# Patient Record
Sex: Male | Born: 1965 | Race: Black or African American | Marital: Married | Smoking: Never smoker
Health system: Southern US, Community
[De-identification: ages and names within clinical notes are randomized; demographics above are authoritative.]

## PROBLEM LIST (undated history)

## (undated) DIAGNOSIS — I10 Essential (primary) hypertension: Secondary | ICD-10-CM

## (undated) DIAGNOSIS — K219 Gastro-esophageal reflux disease without esophagitis: Secondary | ICD-10-CM

## (undated) DIAGNOSIS — M549 Dorsalgia, unspecified: Secondary | ICD-10-CM

## (undated) DIAGNOSIS — F419 Anxiety disorder, unspecified: Secondary | ICD-10-CM

## (undated) HISTORY — PX: KNEE ARTHROSCOPY WITH ANTERIOR CRUCIATE LIGAMENT (ACL) REPAIR: SHX5644

## (undated) HISTORY — DX: Anxiety disorder, unspecified: F41.9

## (undated) HISTORY — DX: Gastro-esophageal reflux disease without esophagitis: K21.9

## (undated) HISTORY — PX: KNEE SURGERY: SHX244

## (undated) HISTORY — DX: Essential (primary) hypertension: I10

## (undated) HISTORY — DX: Dorsalgia, unspecified: M54.9

---

## 2018-07-09 DIAGNOSIS — G4730 Sleep apnea, unspecified: Secondary | ICD-10-CM | POA: Insufficient documentation

## 2018-07-09 DIAGNOSIS — I10 Essential (primary) hypertension: Secondary | ICD-10-CM | POA: Insufficient documentation

## 2020-02-29 DIAGNOSIS — G8929 Other chronic pain: Secondary | ICD-10-CM | POA: Insufficient documentation

## 2020-02-29 DIAGNOSIS — M25572 Pain in left ankle and joints of left foot: Secondary | ICD-10-CM | POA: Insufficient documentation

## 2020-02-29 DIAGNOSIS — I10 Essential (primary) hypertension: Secondary | ICD-10-CM | POA: Insufficient documentation

## 2020-02-29 DIAGNOSIS — M47817 Spondylosis without myelopathy or radiculopathy, lumbosacral region: Secondary | ICD-10-CM | POA: Insufficient documentation

## 2020-04-18 DIAGNOSIS — R200 Anesthesia of skin: Secondary | ICD-10-CM | POA: Insufficient documentation

## 2020-04-18 DIAGNOSIS — G2581 Restless legs syndrome: Secondary | ICD-10-CM | POA: Insufficient documentation

## 2020-08-22 DIAGNOSIS — M179 Osteoarthritis of knee, unspecified: Secondary | ICD-10-CM | POA: Insufficient documentation

## 2020-08-22 DIAGNOSIS — Z6837 Body mass index (BMI) 37.0-37.9, adult: Secondary | ICD-10-CM | POA: Insufficient documentation

## 2020-08-22 DIAGNOSIS — M1710 Unilateral primary osteoarthritis, unspecified knee: Secondary | ICD-10-CM | POA: Insufficient documentation

## 2020-08-24 ENCOUNTER — Other Ambulatory Visit: Payer: Self-pay | Admitting: Neurosurgery

## 2020-08-24 ENCOUNTER — Other Ambulatory Visit (HOSPITAL_COMMUNITY): Payer: Self-pay | Admitting: Neurosurgery

## 2020-08-24 DIAGNOSIS — R29898 Other symptoms and signs involving the musculoskeletal system: Secondary | ICD-10-CM

## 2020-08-31 ENCOUNTER — Other Ambulatory Visit: Payer: Self-pay

## 2020-08-31 ENCOUNTER — Ambulatory Visit (HOSPITAL_COMMUNITY)
Admission: RE | Admit: 2020-08-31 | Discharge: 2020-08-31 | Disposition: A | Discharge status: Home / Self Care | Payer: No Typology Code available for payment source | Source: Ambulatory Visit | Attending: Neurosurgery | Admitting: Neurosurgery

## 2020-08-31 DIAGNOSIS — M48061 Spinal stenosis, lumbar region without neurogenic claudication: Secondary | ICD-10-CM | POA: Insufficient documentation

## 2020-08-31 DIAGNOSIS — R29898 Other symptoms and signs involving the musculoskeletal system: Secondary | ICD-10-CM | POA: Insufficient documentation

## 2020-08-31 DIAGNOSIS — M545 Low back pain, unspecified: Secondary | ICD-10-CM | POA: Diagnosis not present

## 2020-08-31 MED ORDER — ONDANSETRON HCL 4 MG/2ML IJ SOLN: 4.0000 mg | Freq: Four times a day (QID) | INTRAMUSCULAR | Status: DC | PRN

## 2020-08-31 MED ORDER — DIAZEPAM 5 MG PO TABS
ORAL_TABLET | ORAL | Status: AC
  Filled 2020-08-31: qty 2

## 2020-08-31 MED ORDER — DIAZEPAM 5 MG PO TABS
10.0000 mg | ORAL_TABLET | Freq: Once | ORAL | Status: AC
  Administered 2020-08-31: 10 mg via ORAL

## 2020-08-31 MED ORDER — IOHEXOL 180 MG/ML  SOLN
20.0000 mL | Freq: Once | INTRAMUSCULAR | Status: AC | PRN
  Administered 2020-08-31: 18 mL via INTRATHECAL

## 2020-08-31 MED ORDER — TRAMADOL HCL 50 MG PO TABS
50.0000 mg | ORAL_TABLET | Freq: Four times a day (QID) | ORAL | Status: DC | PRN
  Administered 2020-08-31: 100 mg via ORAL
  Filled 2020-08-31: qty 2

## 2020-08-31 MED ORDER — LIDOCAINE HCL (PF) 1 % IJ SOLN
5.0000 mL | Freq: Once | INTRAMUSCULAR | Status: AC
  Administered 2020-08-31: 20 mL via INTRADERMAL

## 2020-08-31 NOTE — Discharge Instructions (Signed)
Myelogram  A myelogram is an imaging test. This test checks for problems in the spinal cord and the places where nerves attach to the spinal cord (nerve roots). A dye (contrast material) is put into your spine before the X-ray. This provides a clearer image for your doctor to see. You may need this test if you have a spinal cord problem that cannot be diagnosed with other imaging tests. You may also have this test to check your spine after surgery. Tell a doctor about:  Any allergies you have, especially to iodine.  All medicines you are taking, including vitamins, herbs, eye drops, creams, and over-the-counter medicines.  Any problems you or family members have had with anesthetic medicines or dye.  Any blood disorders you have.  Any surgeries you have had.  Any medical conditions you have or have had, including asthma.  Whether you are pregnant or may be pregnant. What are the risks? Generally, this is a safe procedure. However, problems may occur, including:  Infection.  Bleeding.  Allergic reaction to medicines or dyes.  Damage to your spinal cord or nerves.  Leaking of spinal fluid. This can cause a headache.  Damage to kidneys.  Seizures. This is rare. What happens before the procedure?  Follow instructions from your doctor about what you cannot eat or drink. You may be asked to drink more fluids.  Ask your doctor about changing or stopping your normal medicines. This is important if you take diabetes medicines or blood thinners.  Plan to have someone take you home from the hospital or clinic.  If you will be going home right after the procedure, plan to have someone with you for 24 hours. What happens during the procedure?  You will lie face down on a table.  Your doctor will find the best injection site on your spine. This is most often in the lower back.  This area will be washed with soap.  You will be given a medicine to numb the area (local  anesthetic).  Your doctor will place a long needle into the space around your spinal cord.  A sample of spinal fluid may be taken. This may be sent to the lab for testing.  The dye will be injected into the space around your spinal cord.  The exam table may be tilted. This helps the dye flow up or down your spine.  The X-ray will take images of your spinal cord.  A bandage (dressing) may be placed over the area where the dye was injected. The procedure may vary among doctors and hospitals. What can I expect after the procedure?  You may be monitored until you leave the hospital or clinic. This includes checking your blood pressure, heart rate, breathing rate, and blood oxygen level.  You may feel sore at the injection site. You may have a mild headache.  You will be told to lie flat with your head raised (elevated). This lowers the risk of a headache.  It is up to you to get the results of your procedure. Ask your doctor, or the department that is doing the procedure, when your results will be ready. Follow these instructions at home:  Rest as told by your doctor. Lie flat with your head slightly elevated.  Do not bend, lift, or do hard work for 24-48 hours, or as told by your doctor.  Take over-the-counter and prescription medicines only as told by your doctor.  Take care of your bandage as told by your doctor.    Drink enough fluid to keep your pee (urine) pale yellow.  Bathe or shower as told by your doctor.   Contact a doctor if:  You have a fever.  You have a headache that lasts longer than 24 hours.  You feel sick to your stomach (nauseous).  You vomit.  Your neck is stiff.  Your legs feel numb.  You cannot pee.  You cannot poop (no bowel movement).  You have a rash.  You are itchy or sneezing. Get help right away if:  You have new symptoms or your symptoms get worse.  You have a seizure.  You have trouble breathing. Summary  A myelogram is an  imaging test that checks for problems in the spinal cord and the places where nerves attach to the spinal cord (nerve roots).  Before the procedure, follow instructions from your doctor. You will be told what not to eat or drink, or what medicines to change or stop.  After the procedure, you will be told to lie flat with your head raised (elevated). This will lower your risk of a headache.  Do not bend, lift, or do any hard work for 24-48 hours, or as told by your doctor.  Contact a doctor if you have a stiff neck or numb legs. Get help right away if your symptoms get worse, or you have a seizure or trouble breathing. This information is not intended to replace advice given to you by your health care provider. Make sure you discuss any questions you have with your health care provider. Document Revised: 09/03/2018 Document Reviewed: 09/04/2018 Elsevier Patient Education  2021 [employer]  

## 2020-08-31 NOTE — Procedures (Signed)
.  Procedure: Lumbar Myelogram via LP w fluoro guidance. Specimen: none Bleeding: minimal. Complications: None immediate. Patient   -Condition: Stable.  -Disposition:  To post Myelo CT, then short stay.  Full Radiology Report to follow under IMAGING

## 2020-09-13 ENCOUNTER — Other Ambulatory Visit: Payer: Self-pay | Admitting: Pain Medicine

## 2020-09-13 DIAGNOSIS — M48062 Spinal stenosis, lumbar region with neurogenic claudication: Secondary | ICD-10-CM

## 2020-09-28 ENCOUNTER — Other Ambulatory Visit: Payer: Self-pay | Admitting: Pain Medicine

## 2020-09-28 DIAGNOSIS — M48062 Spinal stenosis, lumbar region with neurogenic claudication: Secondary | ICD-10-CM

## 2020-10-04 ENCOUNTER — Ambulatory Visit
Admission: RE | Admit: 2020-10-04 | Discharge: 2020-10-04 | Disposition: A | Discharge status: Home / Self Care | Payer: Medicare Other | Source: Ambulatory Visit | Attending: Pain Medicine | Admitting: Pain Medicine

## 2020-10-04 DIAGNOSIS — M48062 Spinal stenosis, lumbar region with neurogenic claudication: Secondary | ICD-10-CM

## 2020-10-07 ENCOUNTER — Other Ambulatory Visit: Payer: Self-pay | Admitting: Neurosurgery

## 2020-10-17 ENCOUNTER — Other Ambulatory Visit: Payer: Self-pay

## 2020-10-17 DIAGNOSIS — G4700 Insomnia, unspecified: Secondary | ICD-10-CM | POA: Insufficient documentation

## 2020-10-17 DIAGNOSIS — Z96651 Presence of right artificial knee joint: Secondary | ICD-10-CM | POA: Insufficient documentation

## 2020-10-17 DIAGNOSIS — M545 Low back pain, unspecified: Secondary | ICD-10-CM | POA: Insufficient documentation

## 2020-10-17 DIAGNOSIS — R12 Heartburn: Secondary | ICD-10-CM | POA: Insufficient documentation

## 2020-10-17 DIAGNOSIS — F4329 Adjustment disorder with other symptoms: Secondary | ICD-10-CM | POA: Insufficient documentation

## 2020-10-17 DIAGNOSIS — Z638 Other specified problems related to primary support group: Secondary | ICD-10-CM | POA: Insufficient documentation

## 2020-10-17 DIAGNOSIS — Z860100 Personal history of colon polyps, unspecified: Secondary | ICD-10-CM | POA: Insufficient documentation

## 2020-10-17 DIAGNOSIS — M2350 Chronic instability of knee, unspecified knee: Secondary | ICD-10-CM | POA: Insufficient documentation

## 2020-10-17 DIAGNOSIS — F339 Major depressive disorder, recurrent, unspecified: Secondary | ICD-10-CM | POA: Insufficient documentation

## 2020-10-17 DIAGNOSIS — L821 Other seborrheic keratosis: Secondary | ICD-10-CM | POA: Insufficient documentation

## 2020-10-17 DIAGNOSIS — R0681 Apnea, not elsewhere classified: Secondary | ICD-10-CM | POA: Insufficient documentation

## 2020-10-17 DIAGNOSIS — M869 Osteomyelitis, unspecified: Secondary | ICD-10-CM | POA: Insufficient documentation

## 2020-10-17 DIAGNOSIS — Z111 Encounter for screening for respiratory tuberculosis: Secondary | ICD-10-CM | POA: Insufficient documentation

## 2020-10-17 DIAGNOSIS — M940 Chondrocostal junction syndrome [Tietze]: Secondary | ICD-10-CM | POA: Insufficient documentation

## 2020-10-17 DIAGNOSIS — D509 Iron deficiency anemia, unspecified: Secondary | ICD-10-CM | POA: Insufficient documentation

## 2020-10-17 DIAGNOSIS — F329 Major depressive disorder, single episode, unspecified: Secondary | ICD-10-CM | POA: Insufficient documentation

## 2020-10-17 DIAGNOSIS — R918 Other nonspecific abnormal finding of lung field: Secondary | ICD-10-CM | POA: Insufficient documentation

## 2020-10-17 DIAGNOSIS — Z9852 Vasectomy status: Secondary | ICD-10-CM | POA: Insufficient documentation

## 2020-10-17 DIAGNOSIS — H11009 Unspecified pterygium of unspecified eye: Secondary | ICD-10-CM | POA: Insufficient documentation

## 2020-10-17 DIAGNOSIS — Z008 Encounter for other general examination: Secondary | ICD-10-CM | POA: Insufficient documentation

## 2020-10-17 DIAGNOSIS — L84 Corns and callosities: Secondary | ICD-10-CM | POA: Insufficient documentation

## 2020-10-17 DIAGNOSIS — M6080 Other myositis, unspecified site: Secondary | ICD-10-CM | POA: Insufficient documentation

## 2020-10-17 DIAGNOSIS — M62838 Other muscle spasm: Secondary | ICD-10-CM | POA: Insufficient documentation

## 2020-10-17 DIAGNOSIS — L509 Urticaria, unspecified: Secondary | ICD-10-CM | POA: Insufficient documentation

## 2020-10-17 DIAGNOSIS — T7840XA Allergy, unspecified, initial encounter: Secondary | ICD-10-CM | POA: Insufficient documentation

## 2020-10-17 DIAGNOSIS — M2140 Flat foot [pes planus] (acquired), unspecified foot: Secondary | ICD-10-CM | POA: Insufficient documentation

## 2020-10-17 DIAGNOSIS — K05323 Chronic periodontitis, generalized, severe: Secondary | ICD-10-CM | POA: Insufficient documentation

## 2020-10-17 DIAGNOSIS — N5089 Other specified disorders of the male genital organs: Secondary | ICD-10-CM | POA: Insufficient documentation

## 2020-10-17 DIAGNOSIS — R269 Unspecified abnormalities of gait and mobility: Secondary | ICD-10-CM | POA: Insufficient documentation

## 2020-10-17 DIAGNOSIS — G4733 Obstructive sleep apnea (adult) (pediatric): Secondary | ICD-10-CM | POA: Insufficient documentation

## 2020-10-17 DIAGNOSIS — M25562 Pain in left knee: Secondary | ICD-10-CM | POA: Insufficient documentation

## 2020-10-17 DIAGNOSIS — F4312 Post-traumatic stress disorder, chronic: Secondary | ICD-10-CM | POA: Insufficient documentation

## 2020-10-17 DIAGNOSIS — K0851 Open restoration margins of tooth: Secondary | ICD-10-CM | POA: Insufficient documentation

## 2020-10-17 DIAGNOSIS — K219 Gastro-esophageal reflux disease without esophagitis: Secondary | ICD-10-CM | POA: Insufficient documentation

## 2020-10-17 DIAGNOSIS — I10 Essential (primary) hypertension: Secondary | ICD-10-CM | POA: Insufficient documentation

## 2020-10-17 DIAGNOSIS — L210 Seborrhea capitis: Secondary | ICD-10-CM | POA: Insufficient documentation

## 2020-10-17 DIAGNOSIS — M542 Cervicalgia: Secondary | ICD-10-CM | POA: Insufficient documentation

## 2020-10-17 DIAGNOSIS — M25532 Pain in left wrist: Secondary | ICD-10-CM | POA: Insufficient documentation

## 2020-10-17 DIAGNOSIS — J309 Allergic rhinitis, unspecified: Secondary | ICD-10-CM | POA: Insufficient documentation

## 2020-10-17 DIAGNOSIS — K036 Deposits [accretions] on teeth: Secondary | ICD-10-CM | POA: Insufficient documentation

## 2020-10-17 DIAGNOSIS — K0854 Contour of existing restoration of tooth biologically incompatible with oral health: Secondary | ICD-10-CM | POA: Insufficient documentation

## 2020-10-17 DIAGNOSIS — M23329 Other meniscus derangements, posterior horn of medial meniscus, unspecified knee: Secondary | ICD-10-CM | POA: Insufficient documentation

## 2020-10-17 DIAGNOSIS — M25559 Pain in unspecified hip: Secondary | ICD-10-CM | POA: Insufficient documentation

## 2020-10-17 DIAGNOSIS — R9431 Abnormal electrocardiogram [ECG] [EKG]: Secondary | ICD-10-CM | POA: Insufficient documentation

## 2020-10-17 DIAGNOSIS — H9190 Unspecified hearing loss, unspecified ear: Secondary | ICD-10-CM | POA: Insufficient documentation

## 2020-10-17 DIAGNOSIS — R0789 Other chest pain: Secondary | ICD-10-CM | POA: Insufficient documentation

## 2020-10-17 DIAGNOSIS — F32A Depression, unspecified: Secondary | ICD-10-CM | POA: Insufficient documentation

## 2020-10-17 DIAGNOSIS — M5136 Other intervertebral disc degeneration, lumbar region: Secondary | ICD-10-CM | POA: Insufficient documentation

## 2020-10-17 DIAGNOSIS — T148XXA Other injury of unspecified body region, initial encounter: Secondary | ICD-10-CM | POA: Insufficient documentation

## 2020-10-17 NOTE — Progress Notes (Signed)
[facility] [place]  ASSESSMENT / PLAN: 55 y.o. male with plans to undergo L5/S1 ALIF with Dr. Franky Macho. I reviewed the conduct of the operation with the patient and explained the rationale for an anterior approach. We discussed the small risk of injury to bowel, ureter, nerve, and vascular structures. He is understanding and would like to proceed.  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  He reports a lifetime of strenuous activity associated with service in the [employer].  He has been under the care of Dr. Franky Macho for back pain. He now requires a L5/S1 anterior interbody lumbar fusion.  About 15 minutes discussing the conduct of the operation, the rationale for having a vascular surgery assist with the procedure, and the risks with an anterior exposure.   No pertinent PMHx / PSHx / FHx / SHx. Non smoker No past abdominal surgeries Married with three adult children Retired from [employer]  Allergies  Allergen Reactions  . Other Hives    Seafood    Current Outpatient Medications  Medication Sig Dispense Refill  . amLODipine (NORVASC) 5 MG tablet Take 5 mg by mouth daily.    . cetirizine (ZYRTEC) 10 MG tablet Take 10 mg by mouth daily.    . chlorhexidine (PERIDEX) 0.12 % solution 15 mLs by Mouth Rinse route 2 (two) times daily.    . clotrimazole (LOTRIMIN) 1 % cream Apply 1 application topically 2 (two) times daily.    . diclofenac Sodium (VOLTAREN) 1 % GEL Apply 1 application topically 4 (four) times daily as needed for pain.    Marland Kitchen EPINEPHrine 0.3 mg/0.3 mL IJ SOAJ injection Inject 0.3 mg into the muscle as needed for anaphylaxis.    . famotidine (PEPCID) 40 MG tablet Take 40 mg by mouth daily.    . fluocinonide (LIDEX) 0.05 % external solution Apply 1 application topically 2 (two) times daily.    Marland Kitchen FLUoxetine (PROZAC) 20 MG capsule Take 40 mg by mouth daily.    Marland Kitchen gabapentin  (NEURONTIN) 600 MG tablet Take 600 mg by mouth 2 (two) times daily.    . hydrochlorothiazide (HYDRODIURIL) 25 MG tablet Take 25 mg by mouth daily.    Marland Kitchen ibuprofen (ADVIL) 800 MG tablet Take 800 mg by mouth every 8 (eight) hours as needed for moderate pain.    Marland Kitchen lidocaine (LIDODERM) 5 % Place 1 patch onto the skin daily as needed for pain.    . methocarbamol (ROBAXIN) 500 MG tablet Take 500 mg by mouth 3 (three) times daily.    . Naphazoline HCl (CLEAR EYES OP) Place 1 drop into both eyes daily as needed (redness).    Marland Kitchen omeprazole (PRILOSEC) 20 MG capsule Take 20 mg by mouth 2 (two) times daily.    . prazosin (MINIPRESS) 5 MG capsule Take 5 mg by mouth 3 (three) times daily.    . risperiDONE (RISPERDAL) 2 MG tablet Take 1 mg by mouth at bedtime.    Marland Kitchen Specialty Vitamins Products (ULTIMATE FAT BURNER PO) Take 1 tablet by mouth 2 (two) times daily.    . traZODone (DESYREL) 150 MG tablet Take 75 mg by mouth at bedtime.    . triamcinolone (KENALOG) 0.025 % cream Apply 1 application topically 2 (two) times daily.    Marland Kitchen VIAGRA 50 MG tablet Take 50 mg by mouth daily as needed for erectile dysfunction.     No current facility-administered medications  for this visit.    REVIEW OF SYSTEMS:  [X]  denotes positive finding, [ ]  denotes negative finding Cardiac  Comments:  Chest pain or chest pressure:    Shortness of breath upon exertion:    Short of breath when lying flat:    Irregular heart rhythm:        Vascular    Pain in calf, thigh, or hip brought on by ambulation:    Pain in feet at night that wakes you up from your sleep:     Blood clot in your veins:    Leg swelling:         Pulmonary    Oxygen at home:    Productive cough:     Wheezing:         Neurologic    Sudden weakness in arms or legs:     Sudden numbness in arms or legs:     Sudden onset of difficulty speaking or slurred speech:    Temporary loss of vision in one eye:     Problems with dizziness:         Gastrointestinal     Blood in stool:     Vomited blood:         Genitourinary    Burning when urinating:     Blood in urine:        Psychiatric    Major depression:         Hematologic    Bleeding problems:    Problems with blood clotting too easily:        Skin    Rashes or ulcers:        Constitutional    Fever or chills:      PHYSICAL EXAM  Vitals:   10/18/20 0959  BP: (!) 139/92  Pulse: 63  Resp: 20  Temp: 98.1 F (36.7 C)  SpO2: 96%  Weight: 269 lb (122 kg)  Height: 5\' 11"  (1.803 m)    Constitutional: well appearing. no distress. Appears well nourished.  Neurologic: CN intact. no focal findings. no sensory loss. Psychiatric: Mood and affect symmetric and appropriate. Eyes: No icterus. No conjunctival pallor. Ears, nose, throat: mucous membranes moist. Midline trachea.  Cardiac: regular rate and rhythm.  Respiratory: unlabored. Abdominal: soft, non-tender, non-distended.  Extremity: No edema. No cyanosis. No pallor.  Skin: No gangrene. No ulceration.  Lymphatic: No Stemmer's sign. No palpable lymphadenopathy.  PERTINENT LABORATORY AND RADIOLOGIC DATA  CT lumbar spine reviewed No aortic calficication No anomalous venous anatomy about the L5/S1 disc space  . 12/18/20, MD [facility] Phone Number: [phone] 10/17/2020 11:37 AM

## 2020-10-18 ENCOUNTER — Encounter: Payer: Self-pay | Admitting: Vascular Surgery

## 2020-10-18 ENCOUNTER — Other Ambulatory Visit: Payer: Self-pay

## 2020-10-18 ENCOUNTER — Ambulatory Visit (INDEPENDENT_AMBULATORY_CARE_PROVIDER_SITE_OTHER): Payer: Medicare Other | Admitting: Vascular Surgery

## 2020-10-18 VITALS — BP 139/92 | HR 63 | Temp 98.1°F | Resp 20 | Ht 71.0 in | Wt 269.0 lb

## 2020-10-18 DIAGNOSIS — G8929 Other chronic pain: Secondary | ICD-10-CM

## 2020-10-18 DIAGNOSIS — M545 Low back pain, unspecified: Secondary | ICD-10-CM | POA: Diagnosis not present

## 2020-11-18 ENCOUNTER — Inpatient Hospital Stay: Admit: 2020-11-18 | Payer: No Typology Code available for payment source | Admitting: Neurosurgery

## 2020-11-18 SURGERY — ANTERIOR LUMBAR FUSION 1 LEVEL
Anesthesia: General

## 2021-03-30 IMAGING — CT CT L SPINE W/O CM
3 of 5 series · 11 of 33 positions shown, 13 images · non-contrast
Comparison: Lumbar myelogram 08/31/2020. MRI lumbar spine
01/18/2020

CLINICAL DATA: Back pain. Radiculopathy. Lumbar stenosis with
neurogenic claudication.

EXAM:
CT LUMBAR SPINE WITHOUT CONTRAST
TECHNIQUE: Multidetector CT imaging of the lumbar spine was performed without
intravenous contrast administration. Multiplanar CT image
reconstructions were also generated.

[Series 4: l-spine 2.00 br60 s3 lspine bone · axial · 0.29mm/px · z∈[+1455,+1575]mm · 3 of 121 slices shown, 4 images]
[im 31/121  soft-tissue]
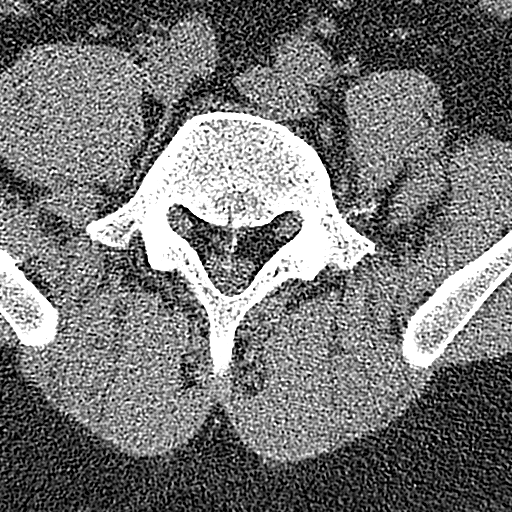
[im 31/121  bone]
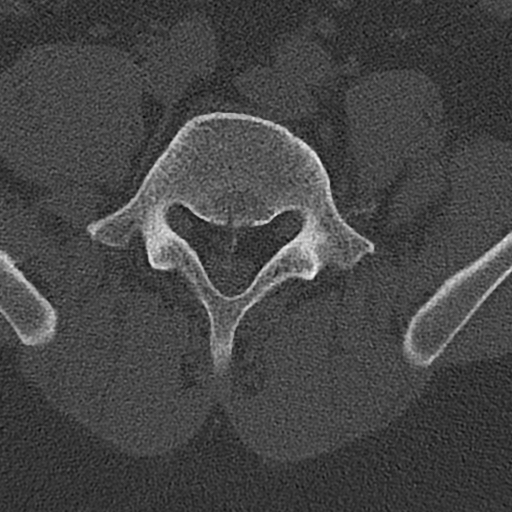
[im 61/121  bone]
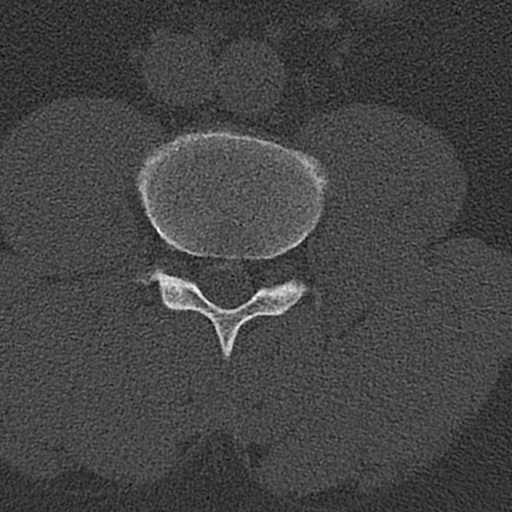
[im 91/121  bone]
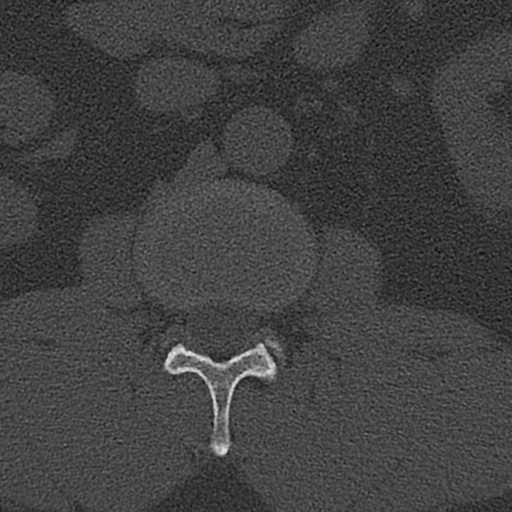

[Series 7: l-spine 2.00 br40 s3 sag soft (person_name) · sagittal · 0.29mm/px · 5 of 73 slices shown, 6 images]
[im 25/73  bone]
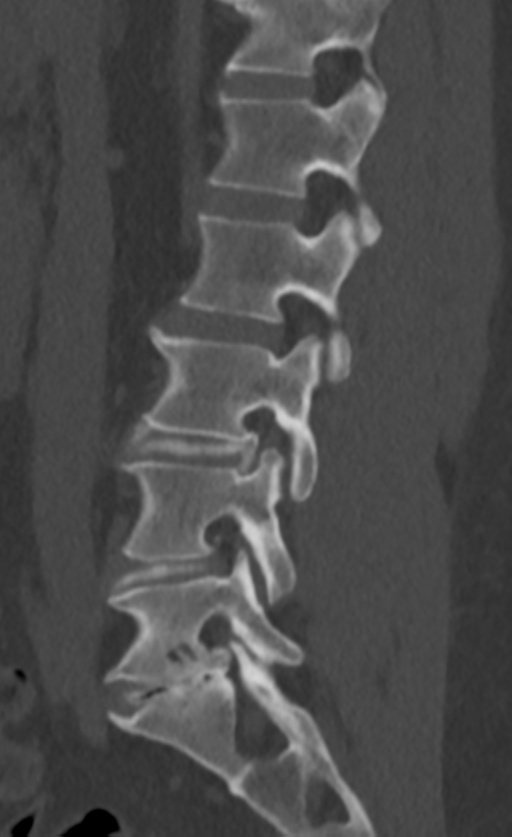
[im 31/73  bone]
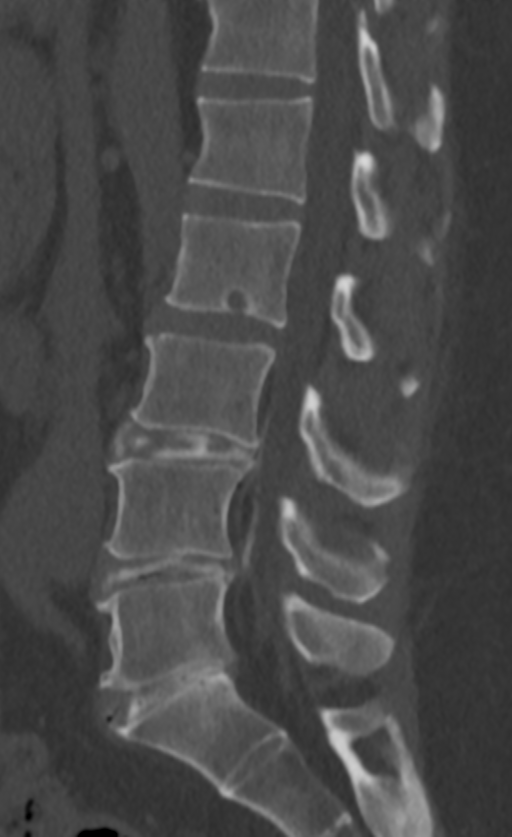
[im 37/73  soft-tissue]
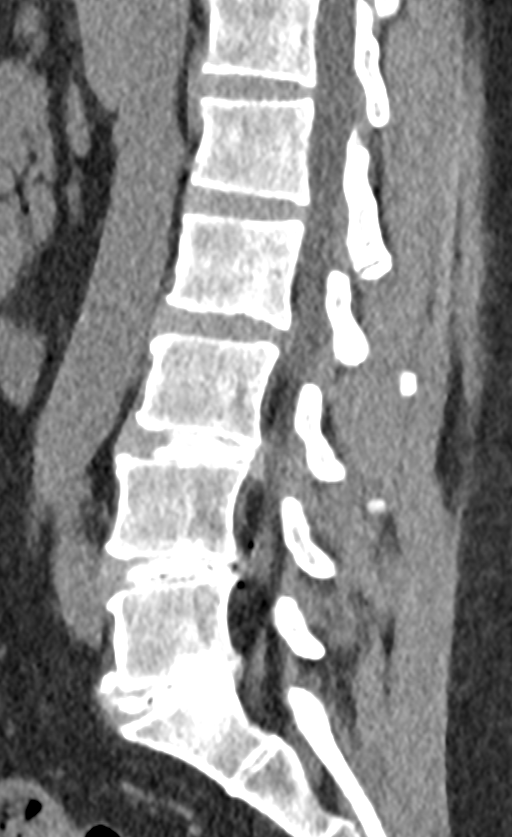
[im 37/73  bone]
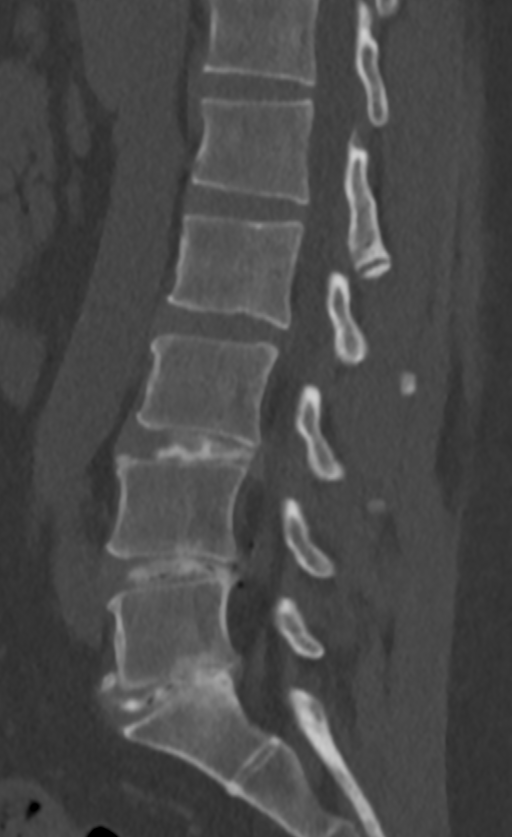
[im 43/73  bone]
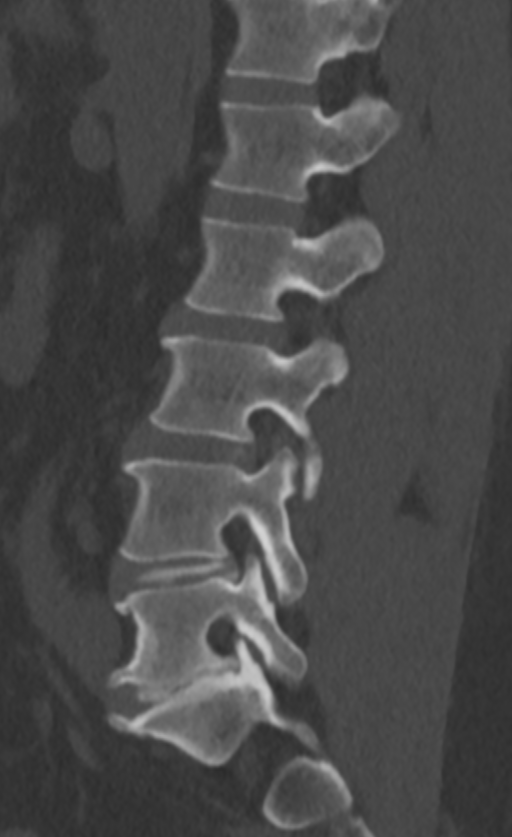
[im 49/73  bone]
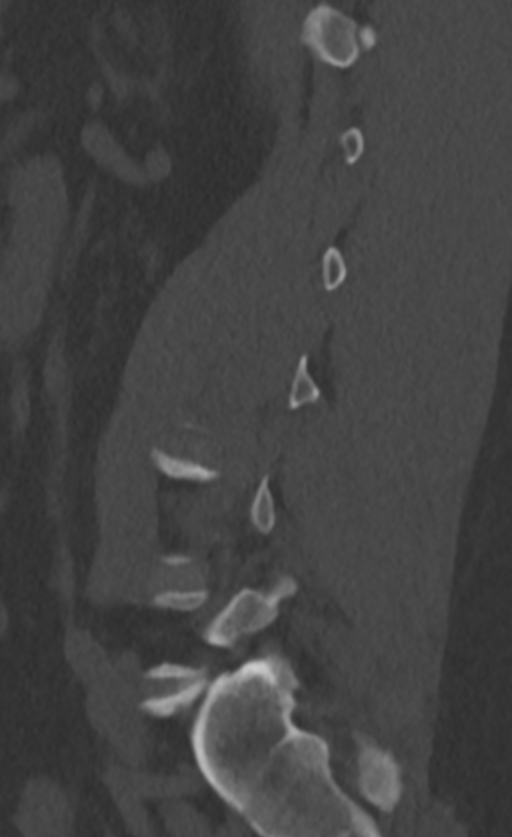

[Series 9: l-spine 2.00 br60 s3 cor bone · coronal · 0.29mm/px · 3 of 74 slices shown]
[im 15/74  bone]
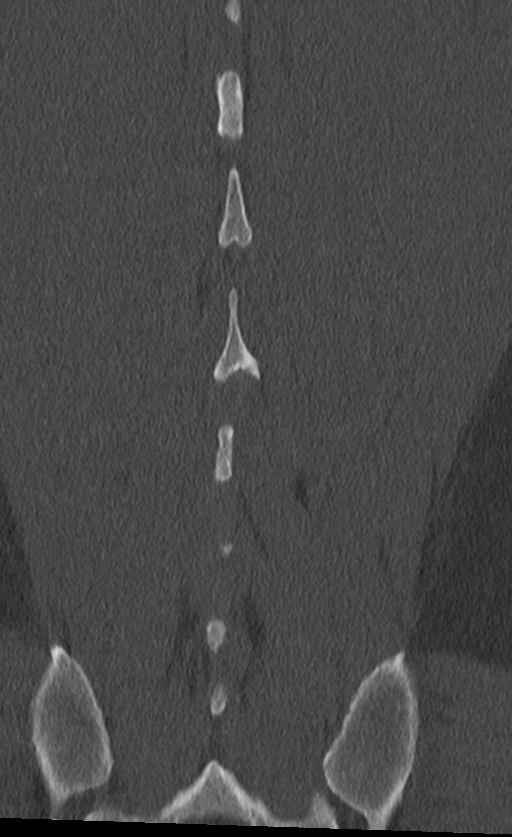
[im 30/74  bone]
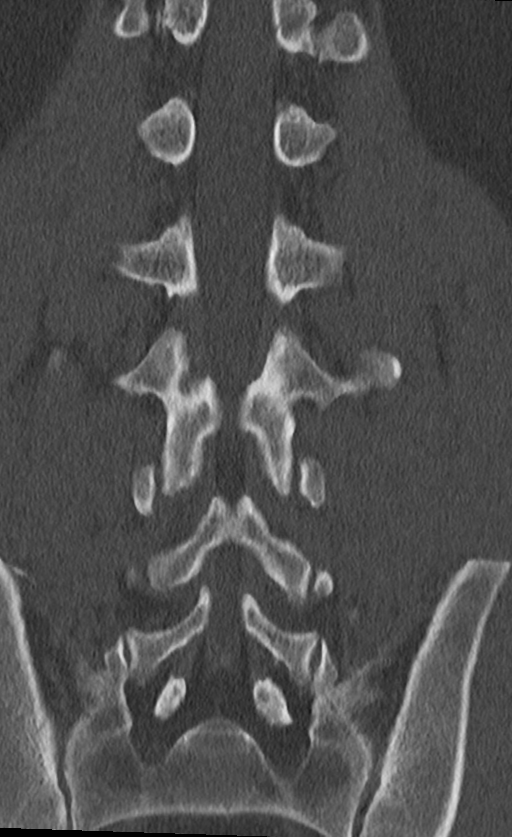
[im 44/74  bone]
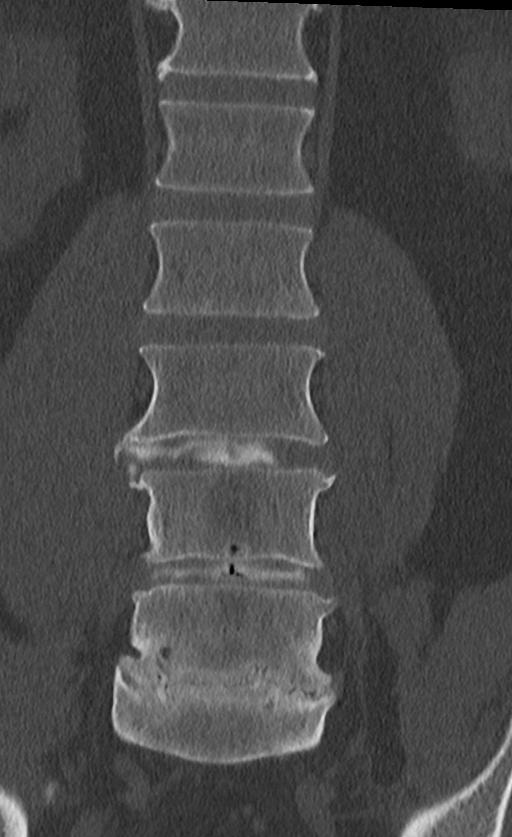

[11 of 33 positions shown; findings below may reference images not displayed]

FINDINGS: CT lumbar spine was performed following discography performed
elsewhere at L3-4, L4-5, and L5-S1.

The lumbar spine is imaged from the midbody of T12 through S2-3.
Alignment is stable. Vertebral body heights are unchanged from the
previous myelogram. Atherosclerotic calcifications are present the
aorta. No aneurysm is present.

Disc levels at L2-3 and above were not injected. No significant
change.

L3-4: Contrast is present in the central nucleus in throughout the
annular fibers of the right side of the disc diffusely. Right
lateral disc calcification again seen. Contrast in the epidural
space is likely from the next level.

L4-5: Contrast present diffusely throughout the disc. Broad-based
disc protrusion again seen. Contrast is seen in the ventral epidural
space in spreads both superiorly and inferiorly.

L5-S1: Contrast present diffusely throughout the disc. No extra
discal contrast noted.
IMPRESSION: 1. Diffuse disc desiccation at L3-4, L4-5, and L5-S1 with contrast
spreading from the nucleus throughout the annular fibers.
2. Focal posterior annular tear at L4-5 with contrast extending into
the ventral epidural space.
3. Spondylosis unchanged from the prior myelogram.

## 2021-12-01 ENCOUNTER — Other Ambulatory Visit: Payer: Self-pay | Admitting: Neurosurgery

## 2021-12-01 DIAGNOSIS — M4316 Spondylolisthesis, lumbar region: Secondary | ICD-10-CM

## 2022-01-19 ENCOUNTER — Ambulatory Visit
Admission: RE | Admit: 2022-01-19 | Discharge: 2022-01-19 | Disposition: A | Discharge status: Home / Self Care | Payer: No Typology Code available for payment source | Source: Ambulatory Visit | Attending: Neurosurgery | Admitting: Neurosurgery

## 2022-01-19 DIAGNOSIS — M4316 Spondylolisthesis, lumbar region: Secondary | ICD-10-CM
